# Patient Record
Sex: Male | Born: 1955 | Race: White | Hispanic: No | Marital: Married | Smoking: Former smoker
Health system: Southern US, Community
[De-identification: ages and names within clinical notes are randomized; demographics above are authoritative.]

## PROBLEM LIST (undated history)

## (undated) DIAGNOSIS — G629 Polyneuropathy, unspecified: Secondary | ICD-10-CM

## (undated) DIAGNOSIS — I10 Essential (primary) hypertension: Secondary | ICD-10-CM

## (undated) DIAGNOSIS — K219 Gastro-esophageal reflux disease without esophagitis: Secondary | ICD-10-CM

## (undated) DIAGNOSIS — M1990 Unspecified osteoarthritis, unspecified site: Secondary | ICD-10-CM

## (undated) HISTORY — PX: COLONOSCOPY WITH PROPOFOL: SHX5780

---

## 2007-03-03 ENCOUNTER — Ambulatory Visit: Payer: Self-pay | Admitting: Internal Medicine

## 2008-07-14 IMAGING — CR DG HIP COMPLETE 2+V*L*
1 series · 2 of 2 positions shown · non-contrast
Comparison: none

REASON FOR EXAM: hip pain
COMMENTS:

PROCEDURE:     DXR - DXR HIP LEFT COMPLETE  - March 03, 2007  [DATE]
RESULT:     No fracture, dislocation or other acute bony abnormality is
identified. The hip joint space is well maintained.

[Series 1: view not recorded · 0.17mm/px · 2 of 2 slices shown]
[im 1/2]
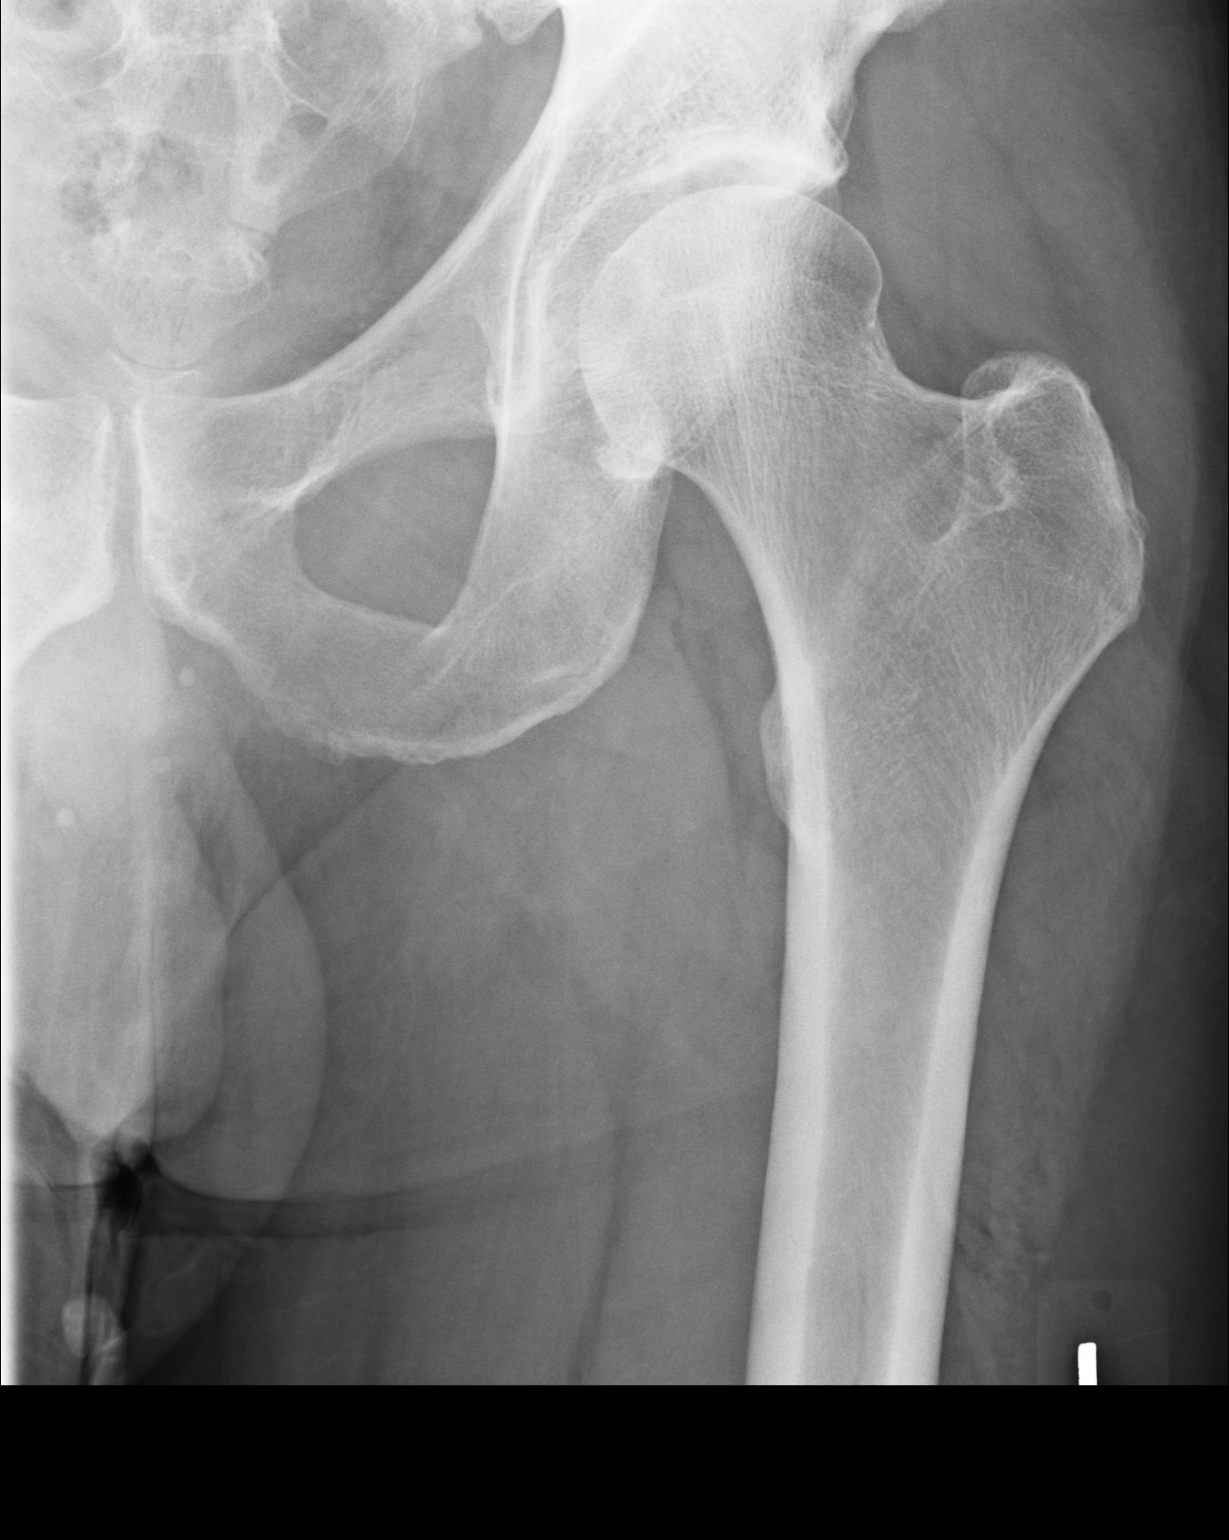
[im 2/2]
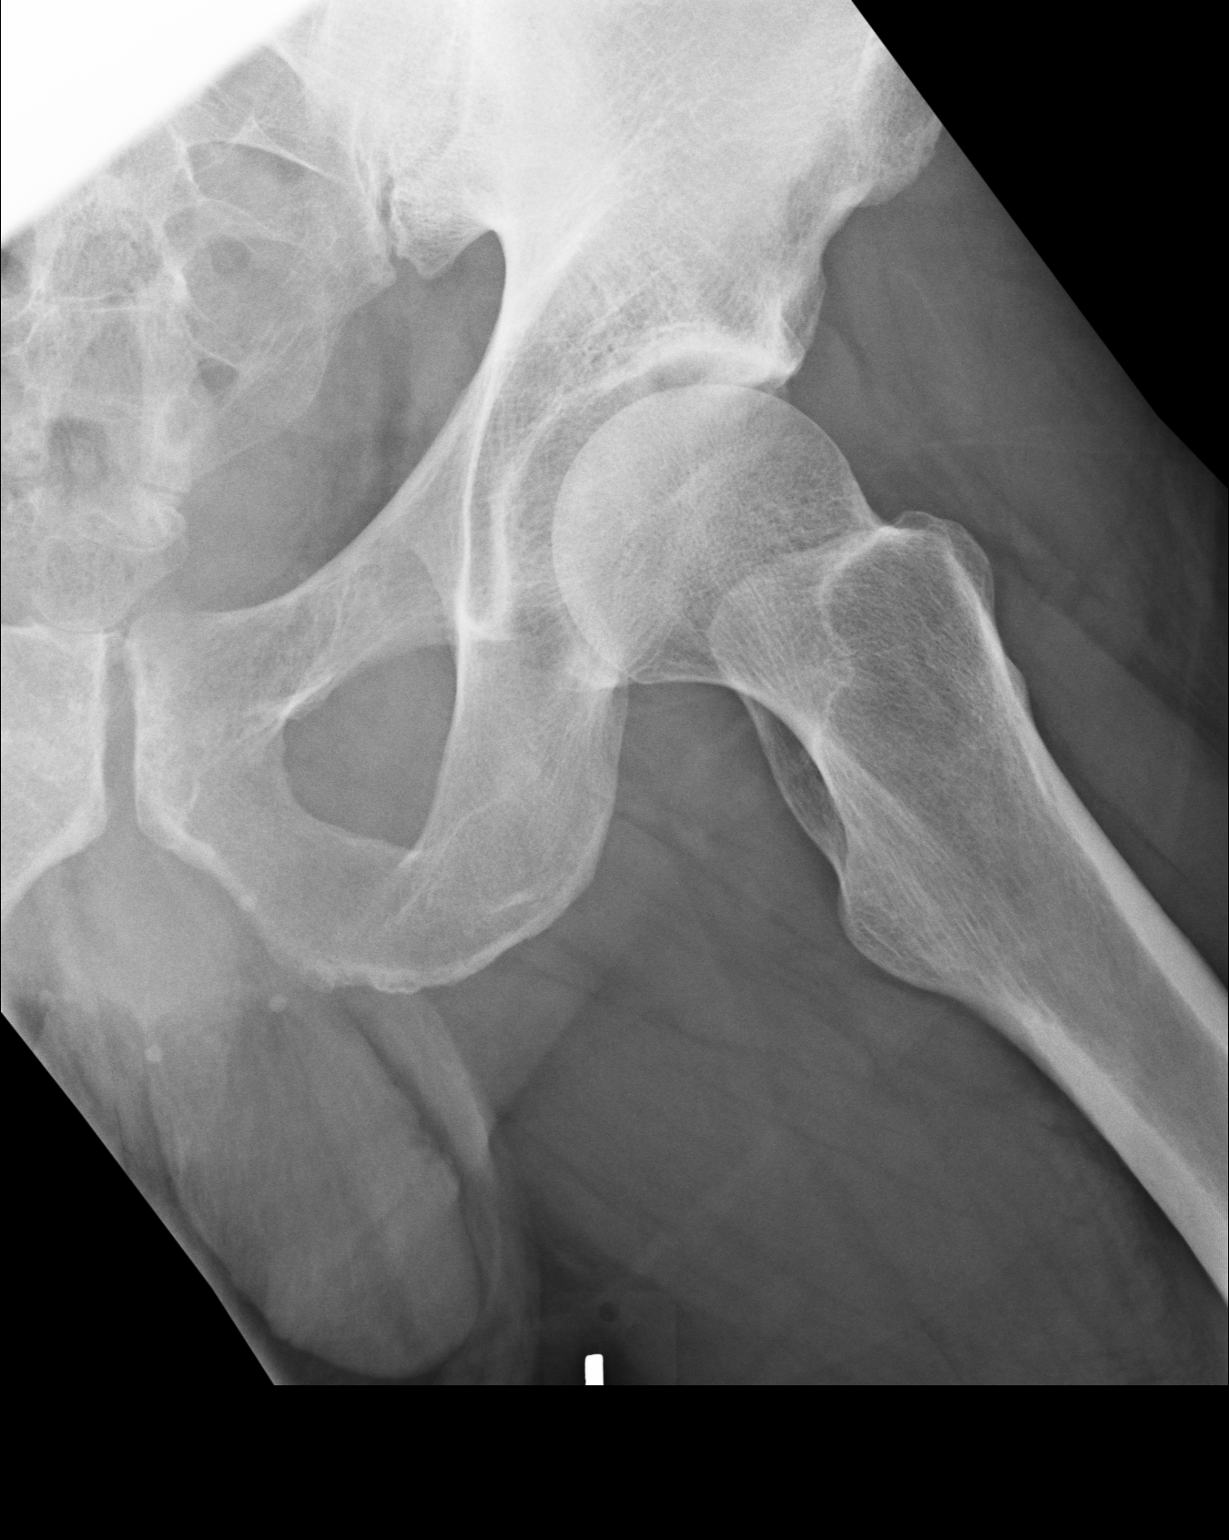

[2 of 2 positions shown; findings below may reference images not displayed]

IMPRESSION: No acute abnormalities are noted.

## 2008-07-14 IMAGING — CR DG LUMBAR SPINE AP/LAT/OBLIQUES W/ FLEX AND EXT
1 series · 5 of 5 positions shown · non-contrast
Comparison: none

REASON FOR EXAM: Back Pain
COMMENTS:

[Series 1: view not recorded · 0.17mm/px · 5 of 5 slices shown]
[im 1/5]
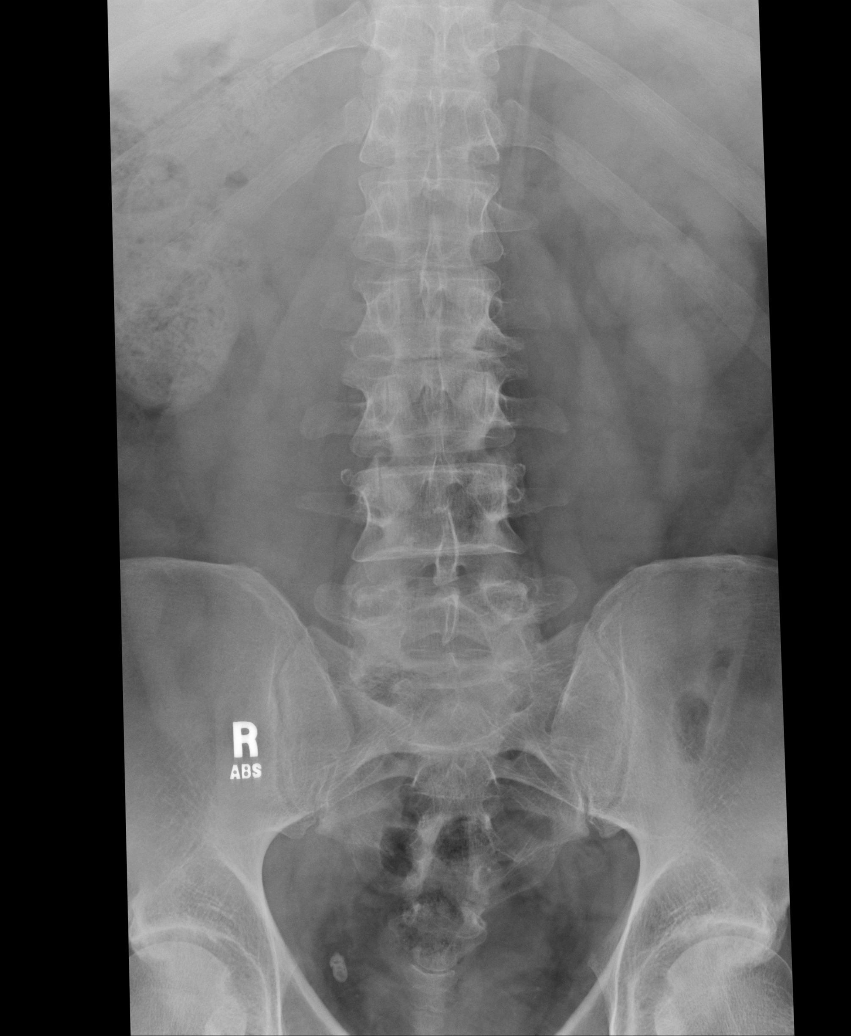
[im 2/5]
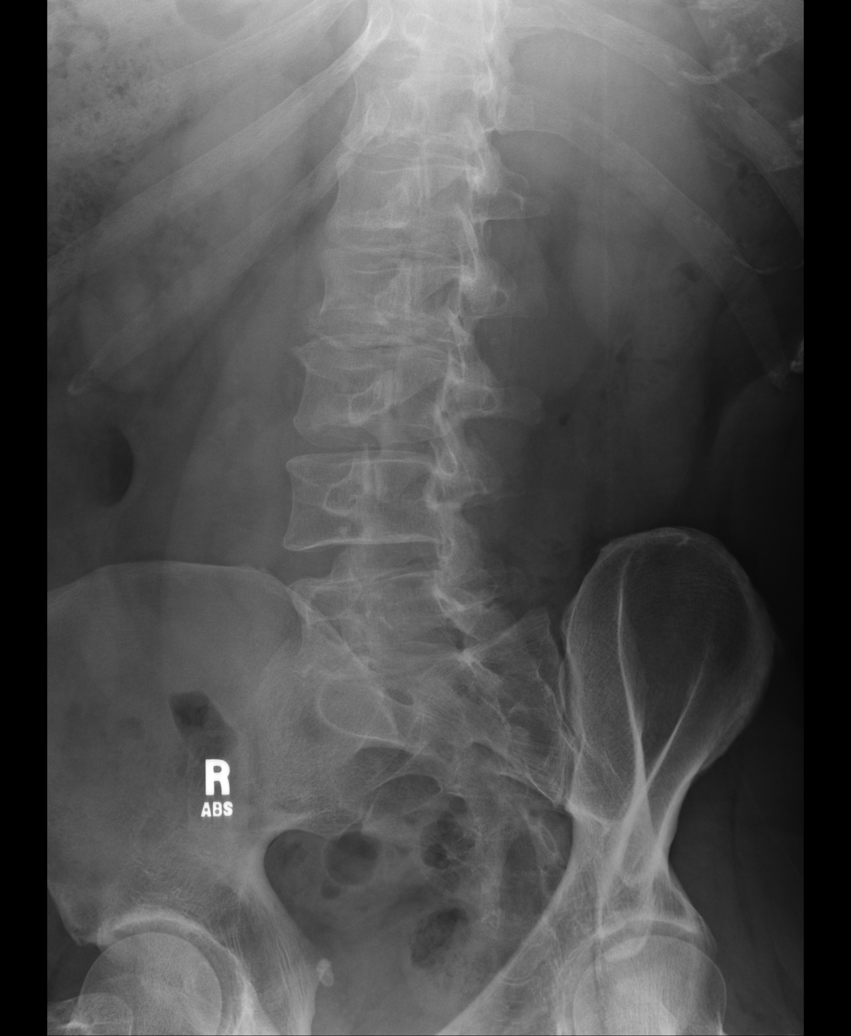
[im 3/5]
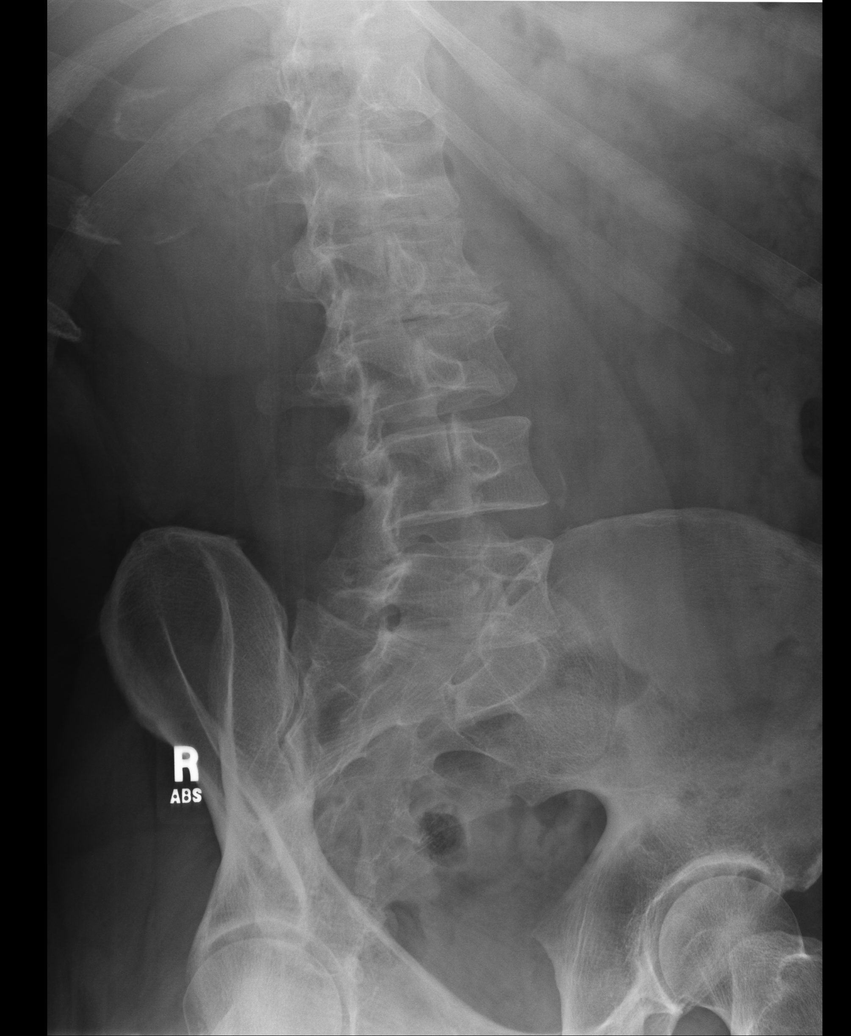
[im 4/5]
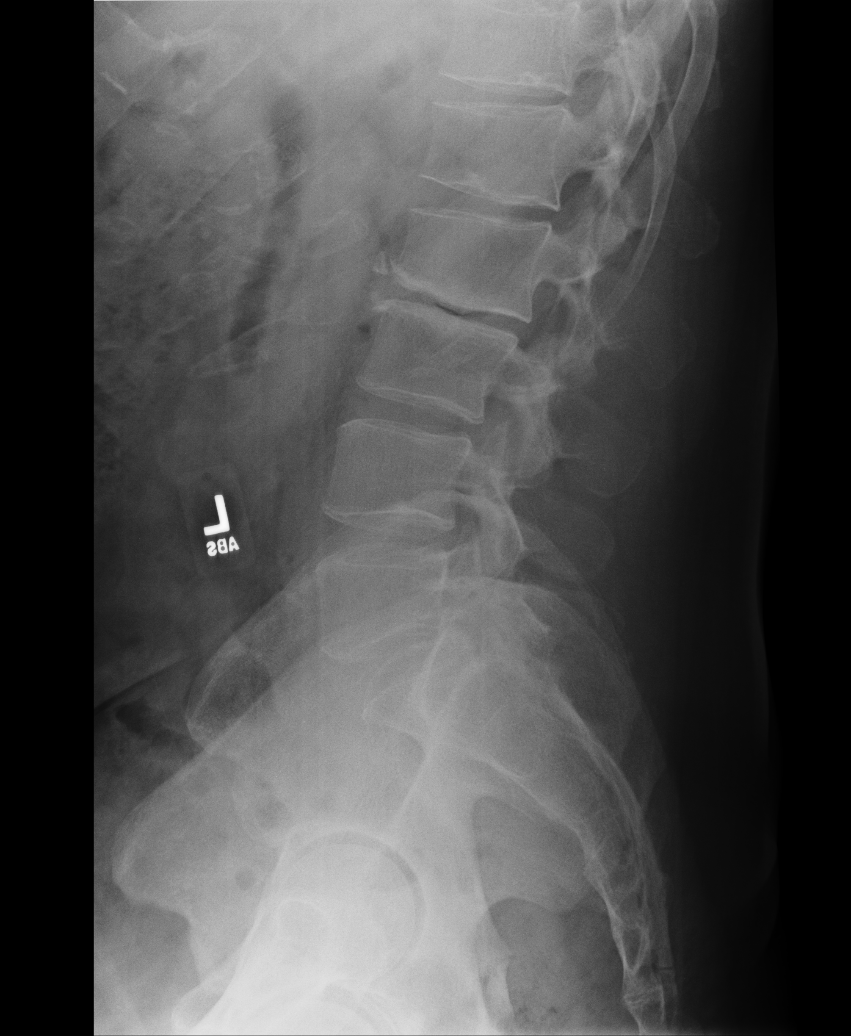
[im 5/5]
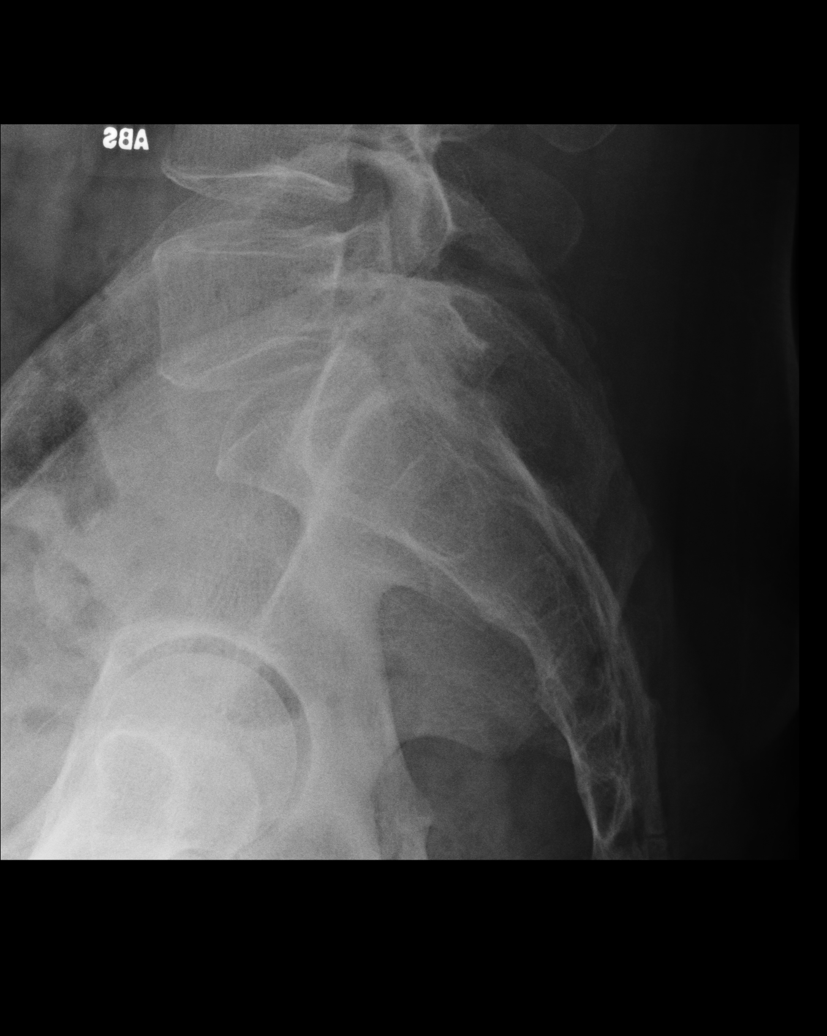

[5 of 5 positions shown; findings below may reference images not displayed]

PROCEDURE:     DXR - DXR LUMBAR SPINE WITH OBLIQUES  - March 03, 2007  [DATE]

RESULT:     The vertebral body heights and the intervertebral disc spaces
are well maintained. The vertebral body alignment is normal. There is
narrowing of the L2-L3 intervertebral disc space compatible with disc
disease. The intervertebral disc spaces otherwise are well maintained.
Vertebral body alignment is normal. Oblique views show no significant
abnormalities of the articular facets. The pedicles are bilaterally intact.
IMPRESSION: 1. No fracture is seen.
2. There is narrowing of the L2-L3 intervertebral disc space consistent with
disc disease.

## 2020-01-18 ENCOUNTER — Other Ambulatory Visit: Payer: Self-pay | Admitting: Internal Medicine

## 2020-01-20 ENCOUNTER — Other Ambulatory Visit: Payer: Self-pay | Admitting: Internal Medicine

## 2020-02-09 ENCOUNTER — Other Ambulatory Visit: Payer: Self-pay | Admitting: Internal Medicine

## 2020-03-17 ENCOUNTER — Ambulatory Visit: Payer: Medicare Other | Admitting: Internal Medicine

## 2020-03-17 ENCOUNTER — Encounter: Payer: Self-pay | Admitting: Internal Medicine

## 2020-03-17 ENCOUNTER — Other Ambulatory Visit: Payer: Self-pay

## 2020-03-17 ENCOUNTER — Ambulatory Visit (INDEPENDENT_AMBULATORY_CARE_PROVIDER_SITE_OTHER): Payer: Medicare Other | Admitting: Internal Medicine

## 2020-03-17 VITALS — BP 121/99 | HR 85 | Ht 70.0 in | Wt 225.3 lb

## 2020-03-17 DIAGNOSIS — M158 Other polyosteoarthritis: Secondary | ICD-10-CM

## 2020-03-17 DIAGNOSIS — M159 Polyosteoarthritis, unspecified: Secondary | ICD-10-CM | POA: Diagnosis not present

## 2020-03-17 DIAGNOSIS — E8881 Metabolic syndrome: Secondary | ICD-10-CM | POA: Diagnosis not present

## 2020-03-17 DIAGNOSIS — Z0000 Encounter for general adult medical examination without abnormal findings: Secondary | ICD-10-CM | POA: Diagnosis not present

## 2020-03-17 DIAGNOSIS — K219 Gastro-esophageal reflux disease without esophagitis: Secondary | ICD-10-CM | POA: Insufficient documentation

## 2020-03-17 DIAGNOSIS — Z87891 Personal history of nicotine dependence: Secondary | ICD-10-CM

## 2020-03-17 LAB — LIPID PANEL
Cholesterol: 160 mg/dL (ref <200)
HDL: 41 mg/dL (ref >=40)
LDL Cholesterol (Calc): 98 mg/dL (calc)
Non-HDL Cholesterol (Calc): 119 mg/dL (calc) (ref <130)
Total CHOL/HDL Ratio: 3.9 (calc) (ref <5.0)
Triglycerides: 112 mg/dL (ref <150)

## 2020-03-17 LAB — COMPLETE METABOLIC PANEL WITH GFR
AG Ratio: 1.7 (calc) (ref 1.0–2.5)
ALT: 15 U/L (ref 9–46)
AST: 17 U/L (ref 10–35)
Albumin: 4.5 g/dL (ref 3.6–5.1)
Alkaline phosphatase (APISO): 62 U/L (ref 35–144)
BUN: 17 mg/dL (ref 7–25)
CO2: 25 mmol/L (ref 20–32)
Calcium: 9.2 mg/dL (ref 8.6–10.3)
Chloride: 101 mmol/L (ref 98–110)
Creat: 0.84 mg/dL (ref 0.70–1.25)
GFR, Est African American: 106 mL/min/{1.73_m2} (ref >=60)
GFR, Est Non African American: 92 mL/min/{1.73_m2} (ref >=60)
Globulin: 2.7 g/dL (calc) (ref 1.9–3.7)
Glucose, Bld: 93 mg/dL (ref 65–99)
Potassium: 4.4 mmol/L (ref 3.5–5.3)
Sodium: 137 mmol/L (ref 135–146)
Total Bilirubin: 0.8 mg/dL (ref 0.2–1.2)
Total Protein: 7.2 g/dL (ref 6.1–8.1)

## 2020-03-17 LAB — CBC WITH DIFFERENTIAL/PLATELET
Absolute Monocytes: 662 cells/uL (ref 200–950)
Basophils Absolute: 22 cells/uL (ref 0–200)
Basophils Relative: 0.3 %
Eosinophils Absolute: 158 cells/uL (ref 15–500)
Eosinophils Relative: 2.2 %
HCT: 47.3 % (ref 38.5–50.0)
Hemoglobin: 15.7 g/dL (ref 13.2–17.1)
Lymphs Abs: 1908 cells/uL (ref 850–3900)
MCH: 29.1 pg (ref 27.0–33.0)
MCHC: 33.2 g/dL (ref 32.0–36.0)
MCV: 87.8 fL (ref 80.0–100.0)
MPV: 9.4 fL (ref 7.5–12.5)
Monocytes Relative: 9.2 %
Neutro Abs: 4450 cells/uL (ref 1500–7800)
Neutrophils Relative %: 61.8 %
Platelets: 258 10*3/uL (ref 140–400)
RBC: 5.39 10*6/uL (ref 4.20–5.80)
RDW: 12.9 % (ref 11.0–15.0)
Total Lymphocyte: 26.5 %
WBC: 7.2 10*3/uL (ref 3.8–10.8)

## 2020-03-17 LAB — TSH: TSH: 0.55 mIU/L (ref 0.40–4.50)

## 2020-03-17 LAB — PSA: PSA: 1.6 ng/mL (ref <=4.0)

## 2020-03-17 NOTE — Progress Notes (Incomplete)
Established Patient Office Visit  SUBJECTIVE:  Subjective  Patient ID: Randy Gill, male    DOB: April 13, 1956  Age: 64 y.o. MRN: [id number]  CC: No chief complaint on file.   HPI Randy Gill is a 64 y.o. male presenting today for ***  No past medical history on file.  *** The histories are not reviewed yet. Please review them in the "History" navigator section and refresh this [place].  No family history on file.  Social History   Socioeconomic History  . Marital status: Married    Spouse name: Not on file  . Number of children: Not on file  . Years of education: Not on file  . Highest education level: Not on file  Occupational History  . Not on file  Tobacco Use  . Smoking status: Not on file  Substance and Sexual Activity  . Alcohol use: Not on file  . Drug use: Not on file  . Sexual activity: Not on file  Other Topics Concern  . Not on file  Social History Narrative  . Not on file   Social Determinants of Health   Financial Resource Strain:   . Difficulty of Paying Living Expenses:   Food Insecurity:   . Worried About Charity fundraiser in the Last Year:   . Arboriculturist in the Last Year:   Transportation Needs:   . Film/video editor (Medical):   Marland Kitchen Lack of Transportation (Non-Medical):   Physical Activity:   . Days of Exercise per Week:   . Minutes of Exercise per Session:   Stress:   . Feeling of Stress :   Social Connections:   . Frequency of Communication with Friends and Family:   . Frequency of Social Gatherings with Friends and Family:   . Attends Religious Services:   . Active Member of Clubs or Organizations:   . Attends Archivist Meetings:   Marland Kitchen Marital Status:   Intimate Partner Violence:   . Fear of Current or Ex-Partner:   . Emotionally Abused:   Marland Kitchen Physically Abused:   . Sexually Abused:      Current Outpatient Medications:  .  meloxicam (MOBIC) 15 MG tablet, TAKE ONE TABLET BY MOUTH EVERY DAY, Disp: 90  tablet, Rfl: 0 .  omeprazole (PRILOSEC) 20 MG capsule, TAKE 1 CAPSULE BY MOUTH ONCE DAILY, Disp: 90 capsule, Rfl: 3   Not on File  ROS Review of Systems  Constitutional: Negative.   HENT: Negative.   Eyes: Negative.   Respiratory: Negative.   Cardiovascular: Negative.   Gastrointestinal: Negative.   Endocrine: Negative.   Genitourinary: Negative.   Musculoskeletal: Negative.   Skin: Negative.   Allergic/Immunologic: Negative.   Neurological: Negative.   Hematological: Negative.   Psychiatric/Behavioral: Negative.   All other systems reviewed and are negative.    OBJECTIVE:    Physical Exam Vitals reviewed.  Constitutional:      Appearance: Normal appearance.  HENT:     Mouth/Throat:     Mouth: Mucous membranes are moist.  Eyes:     Pupils: Pupils are equal, round, and reactive to light.  Neck:     Vascular: No carotid bruit.  Cardiovascular:     Rate and Rhythm: Normal rate and regular rhythm.     Pulses: Normal pulses.     Heart sounds: Normal heart sounds.  Pulmonary:     Effort: Pulmonary effort is normal.     Breath sounds: Normal breath sounds.  Abdominal:  General: Bowel sounds are normal.     Palpations: Abdomen is soft. There is no hepatomegaly, splenomegaly or mass.     Tenderness: There is no abdominal tenderness.     Hernia: No hernia is present.  Musculoskeletal:     Cervical back: Neck supple.     Right lower leg: No edema.     Left lower leg: No edema.  Skin:    Findings: No rash.  Neurological:     Mental Status: He is alert and oriented to person, place, and time.     Motor: No weakness.  Psychiatric:        Mood and Affect: Mood normal.        Behavior: Behavior normal.     There were no vitals taken for this visit. Wt Readings from Last 3 Encounters:  No data found for Wt    Health Maintenance Due  Topic Date Due  . Hepatitis C Screening  Never done  . COVID-19 Vaccine (1) Never done  . HIV Screening  Never done  .  TETANUS/TDAP  Never done  . COLONOSCOPY  Never done  . PNA vac Low Risk Adult (1 of 2 - PCV13) Never done    There are no preventive care reminders to display for this patient.  No flowsheet data found. No flowsheet data found.  No results found for: TSH No results found for: ALBUMIN, ANIONGAP, EGFR, GFR No results found for: CHOL, HDL, LDLCALC, CHOLHDL No results found for: TRIG No results found for: HGBA1C    ASSESSMENT & PLAN:   Problem List Items Addressed This Visit    None      No orders of the defined types were placed in this encounter.   {NOTE: }  Follow-up: No follow-ups on file.    Dr. Jane Canary [facility] [address] [place], [place] [address]   By signing my name below, I, Dawayne Cirri, attest that this documentation has been prepared under the direction and in the presence of Randy Athens, MD. Electronically Signed: Carlean Gill 03/17/20, 8:35 AM   {Add Scribe Attestation Statement}

## 2020-03-17 NOTE — Patient Instructions (Signed)
-  Will see back in January. Will be seen sooner if needed.

## 2020-03-17 NOTE — Assessment & Plan Note (Signed)
Pt physical examination is unremarkable. He has seen the eye doctor last year and is going to see them again. He has his COVID19 vaccines. His prostate examination was done which was normal. Will draw CBC and metabolic panel, TSH, PSA. He will be scheduled to have a colonoscopy.

## 2020-03-17 NOTE — Progress Notes (Signed)
 Established Patient Office Visit  SUBJECTIVE:  Subjective  Patient ID: Randy Gill, male    DOB: 10/03/1955  Age: 64 y.o. MRN: [id number]  CC:  Chief Complaint  Patient presents with  . Annual Exam    welcome to medicare     HPI Randy Gill is a 64 y.o. male presenting today for his welcome to Medicare Physical. The ball of his feet and hands hurt due to osteoarthritis. Pt has heberden's nodes. He is staying active. Pt has not had any eye trouble, or hearing trouble. Pt saw his eye doctor last year. There has been no trouble swallowing.  He has not had pneumonia. Pt has GERD and takes omeprazole daily. He has not gotten a colonoscopy recently. Pt has gotten his COVID19 vaccines. He has lost 15 pounds of weight. His goal is to be 200 pounds. Pt's skin is tender on the lower legs when he runs into things. He has had no chest pain. Awhile ago pt had chest pain said to be due to muscular but lately he has not had any pains. His cholesterol has been good. Pt's brother had prostate cancer.   History reviewed. No pertinent past medical history.  History reviewed. No pertinent surgical history.  History reviewed. No pertinent family history.  Social History   Socioeconomic History  . Marital status: Married    Spouse name: Not on file  . Number of children: Not on file  . Years of education: Not on file  . Highest education level: Not on file  Occupational History  . Not on file  Tobacco Use  . Smoking status: Former Smoker  . Smokeless tobacco: Never Used  Substance and Sexual Activity  . Alcohol use: Yes  . Drug use: Not Currently  . Sexual activity: Yes  Other Topics Concern  . Not on file  Social History Narrative  . Not on file   Social Determinants of Health   Financial Resource Strain:   . Difficulty of Paying Living Expenses:   Food Insecurity:   . Worried About Running Out of Food in the Last Year:   . Ran Out of Food in the Last Year:   Transportation  Needs:   . Lack of Transportation (Medical):   . Lack of Transportation (Non-Medical):   Physical Activity:   . Days of Exercise per Week:   . Minutes of Exercise per Session:   Stress:   . Feeling of Stress :   Social Connections:   . Frequency of Communication with Friends and Family:   . Frequency of Social Gatherings with Friends and Family:   . Attends Religious Services:   . Active Member of Clubs or Organizations:   . Attends Club or Organization Meetings:   . Marital Status:   Intimate Partner Violence:   . Fear of Current or Ex-Partner:   . Emotionally Abused:   . Physically Abused:   . Sexually Abused:      Current Outpatient Medications:  .  ALPRAZolam (XANAX) 0.5 MG tablet, Take 0.5 mg by mouth daily., Disp: , Rfl:  .  amLODipine (NORVASC) 10 MG tablet, Take 10 mg by mouth daily., Disp: , Rfl:  .  DULoxetine (CYMBALTA) 60 MG capsule, Take 60 mg by mouth daily., Disp: , Rfl:  .  losartan-hydrochlorothiazide (HYZAAR) 100-12.5 MG tablet, Take 1 tablet by mouth daily., Disp: , Rfl:  .  meloxicam (MOBIC) 15 MG tablet, TAKE ONE TABLET BY MOUTH EVERY DAY, Disp: 90 tablet, Rfl: 0 .    omeprazole (PRILOSEC) 20 MG capsule, TAKE 1 CAPSULE BY MOUTH ONCE DAILY, Disp: 90 capsule, Rfl: 3 .  sildenafil (REVATIO) 20 MG tablet, Take 20 mg by mouth daily., Disp: , Rfl:    No Known Allergies  ROS Review of Systems  Constitutional: Negative.   HENT: Negative.   Eyes: Negative.   Respiratory: Negative.   Cardiovascular: Negative.   Gastrointestinal: Negative.   Endocrine: Negative.   Genitourinary: Negative.   Musculoskeletal: Negative.   Skin: Negative.   Allergic/Immunologic: Negative.   Neurological: Negative.   Hematological: Negative.   Psychiatric/Behavioral: Negative.   All other systems reviewed and are negative.    OBJECTIVE:    Physical Exam Vitals reviewed.  Constitutional:      Appearance: Normal appearance.  HENT:     Mouth/Throat:     Mouth: Mucous  membranes are moist.  Eyes:     Pupils: Pupils are equal, round, and reactive to light.  Cardiovascular:     Rate and Rhythm: Normal rate and regular rhythm.     Pulses: Normal pulses.     Heart sounds: Normal heart sounds.  Pulmonary:     Effort: Pulmonary effort is normal.     Breath sounds: Normal breath sounds.  Abdominal:     General: Bowel sounds are normal.     Palpations: Abdomen is soft. There is no hepatomegaly or splenomegaly.  Genitourinary:    Prostate: Normal.  Musculoskeletal:     Cervical back: Neck supple.     Comments: heberden's nodes Osteoarthritis   Neurological:     Mental Status: He is alert and oriented to person, place, and time.  Psychiatric:        Mood and Affect: Mood normal.        Behavior: Behavior normal.     BP (!) 121/99   Pulse 85   Ht 5' 10" (1.778 m)   Wt (!) 225 lb 4.8 oz (102.2 kg)   BMI 32.33 kg/m  Wt Readings from Last 3 Encounters:  03/17/20 (!) 225 lb 4.8 oz (102.2 kg)    Health Maintenance Due  Topic Date Due  . Hepatitis C Screening  Never done  . COVID-19 Vaccine (1) Never done  . HIV Screening  Never done  . TETANUS/TDAP  Never done  . COLONOSCOPY  Never done  . PNA vac Low Risk Adult (1 of 2 - PCV13) Never done    There are no preventive care reminders to display for this patient.  No flowsheet data found. No flowsheet data found.  No results found for: TSH No results found for: ALBUMIN, ANIONGAP, EGFR, GFR No results found for: CHOL, HDL, LDLCALC, CHOLHDL No results found for: TRIG No results found for: HGBA1C    ASSESSMENT & PLAN:   Problem List Items Addressed This Visit      Digestive   Gastroesophageal reflux disease without esophagitis    - The patient's GERD is stable on medication.  - Instructed the patient to avoid eating spicy and acidic foods, as well as foods high in fat. - Instructed the patient to avoid eating large meals or meals 2-3 hours prior to sleeping.         Musculoskeletal  and Integument   Osteoarthritis of multiple joints     Other   Metabolic syndrome    Pt has lost weight. He is very active      Annual physical exam - Primary    Pt physical examination is unremarkable. He has seen the eye  doctor last year and is going to see them again. He has his COVID19 vaccines. His prostate examination was done which was normal. Will draw CBC and metabolic panel, TSH, PSA. He will be scheduled to have a colonoscopy.          No orders of the defined types were placed in this encounter.    Follow-up: Return in about 6 months (around 09/17/2020).  Pt will be scheduled for colonscopy, eye doctor check, blood will be drawn for CBC, metabolic pane, TSH, PSA. Pt already had a CVOID19 vaccines. His prostate examination was normal.   Dr. Javed Masoud [facility] [address], [place], [place] [address]   By signing my name below, I, Elaine Hinkel, attest that this documentation has been prepared under the direction and in the presence of Masoud, Javed, MD. Electronically Signed: Javed Masoud, MD 03/17/20, 10:16 AM   I personally performed the services described in this documentation, which was SCRIBED in my presence. The recorded information has been reviewed and considered accurate. It has been edited as necessary during review. Javed Masoud, MD   

## 2020-03-17 NOTE — Assessment & Plan Note (Signed)
-   The patient's GERD is stable on medication.  - Instructed the patient to avoid eating spicy and acidic foods, as well as foods high in fat. - Instructed the patient to avoid eating large meals or meals 2-3 hours prior to sleeping. 

## 2020-03-17 NOTE — Assessment & Plan Note (Signed)
Pt has lost weight. He is very active

## 2020-03-26 ENCOUNTER — Other Ambulatory Visit: Payer: Self-pay | Admitting: Internal Medicine

## 2020-03-29 ENCOUNTER — Other Ambulatory Visit: Payer: Self-pay

## 2020-03-29 ENCOUNTER — Other Ambulatory Visit: Payer: Self-pay | Admitting: Internal Medicine

## 2020-03-30 ENCOUNTER — Telehealth (INDEPENDENT_AMBULATORY_CARE_PROVIDER_SITE_OTHER): Payer: Self-pay | Admitting: Gastroenterology

## 2020-03-30 ENCOUNTER — Other Ambulatory Visit: Payer: Self-pay

## 2020-03-30 DIAGNOSIS — S82409A Unspecified fracture of shaft of unspecified fibula, initial encounter for closed fracture: Secondary | ICD-10-CM | POA: Insufficient documentation

## 2020-03-30 DIAGNOSIS — Z1211 Encounter for screening for malignant neoplasm of colon: Secondary | ICD-10-CM

## 2020-03-30 DIAGNOSIS — S93409A Sprain of unspecified ligament of unspecified ankle, initial encounter: Secondary | ICD-10-CM | POA: Insufficient documentation

## 2020-03-30 MED ORDER — GOLYTELY 236 G PO SOLR: 4000.0000 mL | Freq: Once | ORAL | 0 refills | Status: AC

## 2020-03-30 NOTE — Progress Notes (Signed)
Gastroenterology Pre-Procedure Review  Request Date: Wed 04/13/20 Requesting Physician: Dr. Maximino Greenland  PATIENT REVIEW QUESTIONS: The patient responded to the following health history questions as indicated:    1. Are you having any GI issues? no 2. Do you have a personal history of Polyps? no 3. Do you have a family history of Colon Cancer or Polyps? no 4. Diabetes Mellitus? no 5. Joint replacements in the past 12 months?no 6. Major health problems in the past 3 months?no 7. Any artificial heart valves, MVP, or defibrillator?no    MEDICATIONS & ALLERGIES:    Patient reports the following regarding taking any anticoagulation/antiplatelet therapy:   Plavix, Coumadin, Eliquis, Xarelto, Lovenox, Pradaxa, Brilinta, or Effient? no Aspirin? no  Patient confirms/reports the following medications:  Current Outpatient Medications  Medication Sig Dispense Refill  . ALPRAZolam (XANAX) 0.5 MG tablet Take 0.5 mg by mouth daily.    Marland Kitchen amLODipine (NORVASC) 10 MG tablet Take 10 mg by mouth daily.    . DULoxetine (CYMBALTA) 60 MG capsule TAKE 1 CAPSULE BY MOUTH EVERY DAY 90 capsule 3  . losartan-hydrochlorothiazide (HYZAAR) 100-12.5 MG tablet Take 1 tablet by mouth daily.    . meloxicam (MOBIC) 15 MG tablet TAKE ONE TABLET BY MOUTH EVERY DAY 90 tablet 0  . omeprazole (PRILOSEC) 20 MG capsule TAKE 1 CAPSULE BY MOUTH ONCE DAILY 90 capsule 3  . sildenafil (REVATIO) 20 MG tablet Take 20 mg by mouth daily.    . polyethylene glycol (GOLYTELY) 236 g solution Take 4,000 mLs by mouth once for 1 dose. 4000 mL 0   No current facility-administered medications for this visit.    Patient confirms/reports the following allergies:  No Known Allergies  No orders of the defined types were placed in this encounter.   AUTHORIZATION INFORMATION Primary Insurance: 1D#: Group #:  Secondary Insurance: 1D#: Group #:  SCHEDULE INFORMATION: Date: Wed 04/13/20 Time: Location:[facility]

## 2020-04-11 ENCOUNTER — Other Ambulatory Visit
Admission: RE | Admit: 2020-04-11 | Discharge: 2020-04-11 | Disposition: A | Discharge status: Home / Self Care | Payer: Medicare Other | Source: Ambulatory Visit | Attending: Gastroenterology | Admitting: Gastroenterology

## 2020-04-11 ENCOUNTER — Other Ambulatory Visit: Payer: Self-pay

## 2020-04-11 DIAGNOSIS — Z20822 Contact with and (suspected) exposure to covid-19: Secondary | ICD-10-CM | POA: Diagnosis not present

## 2020-04-11 DIAGNOSIS — Z01812 Encounter for preprocedural laboratory examination: Secondary | ICD-10-CM | POA: Diagnosis present

## 2020-04-12 LAB — SARS CORONAVIRUS 2 (TAT 6-24 HRS): SARS Coronavirus 2: NEGATIVE

## 2020-04-13 ENCOUNTER — Ambulatory Visit
Admission: RE | Admit: 2020-04-13 | Discharge: 2020-04-13 | Disposition: A | Discharge status: Home / Self Care | Payer: Medicare Other | Attending: Gastroenterology | Admitting: Gastroenterology

## 2020-04-13 ENCOUNTER — Ambulatory Visit: Payer: Medicare Other | Admitting: Certified Registered Nurse Anesthetist

## 2020-04-13 ENCOUNTER — Encounter
Admission: RE | Disposition: A | Discharge status: Home / Self Care | Payer: Self-pay | Source: Home / Self Care | Attending: Gastroenterology

## 2020-04-13 ENCOUNTER — Encounter: Payer: Self-pay | Admitting: Gastroenterology

## 2020-04-13 ENCOUNTER — Other Ambulatory Visit: Payer: Self-pay

## 2020-04-13 DIAGNOSIS — M1990 Unspecified osteoarthritis, unspecified site: Secondary | ICD-10-CM | POA: Insufficient documentation

## 2020-04-13 DIAGNOSIS — Z79899 Other long term (current) drug therapy: Secondary | ICD-10-CM | POA: Diagnosis not present

## 2020-04-13 DIAGNOSIS — K219 Gastro-esophageal reflux disease without esophagitis: Secondary | ICD-10-CM | POA: Insufficient documentation

## 2020-04-13 DIAGNOSIS — Z87891 Personal history of nicotine dependence: Secondary | ICD-10-CM | POA: Insufficient documentation

## 2020-04-13 DIAGNOSIS — K635 Polyp of colon: Secondary | ICD-10-CM | POA: Diagnosis not present

## 2020-04-13 DIAGNOSIS — I10 Essential (primary) hypertension: Secondary | ICD-10-CM | POA: Diagnosis not present

## 2020-04-13 DIAGNOSIS — Z791 Long term (current) use of non-steroidal anti-inflammatories (NSAID): Secondary | ICD-10-CM | POA: Diagnosis not present

## 2020-04-13 DIAGNOSIS — K5730 Diverticulosis of large intestine without perforation or abscess without bleeding: Secondary | ICD-10-CM | POA: Insufficient documentation

## 2020-04-13 DIAGNOSIS — Z1211 Encounter for screening for malignant neoplasm of colon: Secondary | ICD-10-CM | POA: Insufficient documentation

## 2020-04-13 HISTORY — DX: Essential (primary) hypertension: I10

## 2020-04-13 HISTORY — PX: COLONOSCOPY WITH PROPOFOL: SHX5780

## 2020-04-13 HISTORY — DX: Gastro-esophageal reflux disease without esophagitis: K21.9

## 2020-04-13 HISTORY — DX: Unspecified osteoarthritis, unspecified site: M19.90

## 2020-04-13 HISTORY — DX: Polyneuropathy, unspecified: G62.9

## 2020-04-13 SURGERY — COLONOSCOPY WITH PROPOFOL
Anesthesia: General

## 2020-04-13 MED ORDER — LIDOCAINE HCL (PF) 2 % IJ SOLN
INTRAMUSCULAR | Status: AC
  Filled 2020-04-13: qty 5

## 2020-04-13 MED ORDER — SODIUM CHLORIDE 0.9 % IV SOLN: INTRAVENOUS | Status: DC

## 2020-04-13 MED ORDER — PROPOFOL 10 MG/ML IV BOLUS
INTRAVENOUS | Status: DC | PRN
  Administered 2020-04-13: 30 mg via INTRAVENOUS
  Administered 2020-04-13: 60 mg via INTRAVENOUS
  Administered 2020-04-13: 20 mg via INTRAVENOUS

## 2020-04-13 MED ORDER — PROPOFOL 500 MG/50ML IV EMUL
INTRAVENOUS | Status: DC | PRN
  Administered 2020-04-13: 150 ug/kg/min via INTRAVENOUS

## 2020-04-13 MED ORDER — PROPOFOL 500 MG/50ML IV EMUL
INTRAVENOUS | Status: AC
  Filled 2020-04-13: qty 50

## 2020-04-13 MED ORDER — LIDOCAINE HCL (CARDIAC) PF 100 MG/5ML IV SOSY
PREFILLED_SYRINGE | INTRAVENOUS | Status: DC | PRN
  Administered 2020-04-13: 50 mg via INTRAVENOUS

## 2020-04-13 NOTE — Anesthesia Postprocedure Evaluation (Signed)
Anesthesia Post Note  Patient: Randy Gill  Procedure(s) Performed: COLONOSCOPY WITH PROPOFOL (N/A )  Patient location during evaluation: Endoscopy Anesthesia Type: General Level of consciousness: awake and alert Pain management: pain level controlled Vital Signs Assessment: post-procedure vital signs reviewed and stable Respiratory status: spontaneous breathing, nonlabored ventilation, respiratory function stable and patient connected to nasal cannula oxygen Cardiovascular status: blood pressure returned to baseline and stable Postop Assessment: no apparent nausea or vomiting Anesthetic complications: no   No complications documented.   Last Vitals:  Vitals:   04/13/20 1018 04/13/20 1028  BP: 102/78 101/80  Pulse: 60 67  Resp: 13 11  Temp:    SpO2: 97% 100%    Last Pain:  Vitals:   04/13/20 1028  TempSrc:   PainSc: 0-No pain                 Corinda Gubler

## 2020-04-13 NOTE — Anesthesia Preprocedure Evaluation (Signed)
Anesthesia Evaluation  Patient identified by MRN, date of birth, ID band Patient awake    Reviewed: Allergy & Precautions, NPO status , Patient's Chart, lab work & pertinent test results  History of Anesthesia Complications Negative for: history of anesthetic complications  Airway Mallampati: II  TM Distance: >3 FB Neck ROM: Full    Dental no notable dental hx. (+) Teeth Intact   Pulmonary neg pulmonary ROS, neg sleep apnea, neg COPD, Patient abstained from smoking.Not current smoker, former smoker,    Pulmonary exam normal breath sounds clear to auscultation       Cardiovascular Exercise Tolerance: Good METShypertension, (-) CAD and (-) Past MI (-) dysrhythmias  Rhythm:Regular Rate:Normal - Systolic murmurs    Neuro/Psych negative neurological ROS  negative psych ROS   GI/Hepatic GERD  Medicated and Controlled,(+)     (-) substance abuse  ,   Endo/Other  neg diabetes  Renal/GU negative Renal ROS     Musculoskeletal   Abdominal   Peds  Hematology   Anesthesia Other Findings Past Medical History: No date: GERD (gastroesophageal reflux disease) No date: Hypertension No date: Neuropathy No date: Osteoarthritis  Reproductive/Obstetrics                             Anesthesia Physical Anesthesia Plan  ASA: II  Anesthesia Plan: General   Post-op Pain Management:    Induction: Intravenous  PONV Risk Score and Plan: 2 and Ondansetron, Propofol infusion and TIVA  Airway Management Planned: Nasal Cannula  Additional Equipment: None  Intra-op Plan:   Post-operative Plan:   Informed Consent: I have reviewed the patients History and Physical, chart, labs and discussed the procedure including the risks, benefits and alternatives for the proposed anesthesia with the patient or authorized representative who has indicated his/her understanding and acceptance.     Dental advisory  given  Plan Discussed with: CRNA and Surgeon  Anesthesia Plan Comments: (Discussed risks of anesthesia with patient, including possibility of difficulty with spontaneous ventilation under anesthesia necessitating airway intervention, PONV, and rare risks such as cardiac or respiratory or neurological events. Patient understands.)        Anesthesia Quick Evaluation

## 2020-04-13 NOTE — Transfer of Care (Signed)
Immediate Anesthesia Transfer of Care Note  Patient: Randy Gill  Procedure(s) Performed: COLONOSCOPY WITH PROPOFOL (N/A )  Patient Location: PACU  Anesthesia Type:General  Level of Consciousness: drowsy  Airway & Oxygen Therapy: Patient Spontanous Breathing  Post-op Assessment: Report given to RN and Post -op Vital signs reviewed and stable  Post vital signs: Reviewed and stable  Last Vitals:  Vitals Value Taken Time  BP 95/66 04/13/20 0958  Temp 36.1 C 04/13/20 0958  Pulse 69 04/13/20 0959  Resp 20 04/13/20 0959  SpO2 97 % 04/13/20 0959  Vitals shown include unvalidated device data.  Last Pain:  Vitals:   04/13/20 0958  TempSrc: Tympanic  PainSc: Asleep         Complications: No complications documented.

## 2020-04-13 NOTE — H&P (Signed)
Melodie Bouillon, MD [address], [address], [place], [place], [address] [address], [address], [place], [place], [address] Phone: [phone]  Fax: [phone]  Primary Care Physician:  Corky Downs, MD   Pre-Procedure History & Physical: HPI:  Randy Gill is a 64 y.o. male is here for a colonoscopy.   Past Medical History:  Diagnosis Date  . GERD (gastroesophageal reflux disease)   . Hypertension   . Neuropathy   . Osteoarthritis     Past Surgical History:  Procedure Laterality Date  . COLONOSCOPY WITH PROPOFOL      Prior to Admission medications   Medication Sig Start Date End Date Taking? Authorizing Provider  amLODipine (NORVASC) 10 MG tablet Take 10 mg by mouth daily. 01/20/20  Yes [provider]  DULoxetine (CYMBALTA) 60 MG capsule TAKE 1 CAPSULE BY MOUTH EVERY DAY 03/28/20  Yes Masoud, Renda Rolls, MD  losartan-hydrochlorothiazide (HYZAAR) 100-12.5 MG tablet Take 1 tablet by mouth daily. 02/09/20  Yes [provider]  meloxicam (MOBIC) 15 MG tablet TAKE ONE TABLET BY MOUTH EVERY DAY 03/29/20  Yes Masoud, Renda Rolls, MD  omeprazole (PRILOSEC) 20 MG capsule TAKE 1 CAPSULE BY MOUTH ONCE DAILY 02/09/20  Yes Masoud, Renda Rolls, MD  ALPRAZolam Prudy Feeler) 0.5 MG tablet Take 0.5 mg by mouth daily.    [provider]  sildenafil (REVATIO) 20 MG tablet Take 20 mg by mouth daily. 02/09/20   [provider]    Allergies as of 03/30/2020  . (No Known Allergies)    History reviewed. No pertinent family history.  Social History   Socioeconomic History  . Marital status: Married    Spouse name: Not on file  . Number of children: Not on file  . Years of education: Not on file  . Highest education level: Not on file  Occupational History  . Not on file  Tobacco Use  . Smoking status: Former Games developer  . Smokeless tobacco: Never Used  Substance and Sexual Activity  . Alcohol use: Yes  . Drug use: Not Currently    Types: Marijuana  . Sexual activity:  Yes  Other Topics Concern  . Not on file  Social History Narrative  . Not on file   Social Determinants of Health   Financial Resource Strain:   . Difficulty of Paying Living Expenses: Not on file  Food Insecurity:   . Worried About Programme researcher, broadcasting/film/video in the Last Year: Not on file  . Ran Out of Food in the Last Year: Not on file  Transportation Needs:   . Lack of Transportation (Medical): Not on file  . Lack of Transportation (Non-Medical): Not on file  Physical Activity:   . Days of Exercise per Week: Not on file  . Minutes of Exercise per Session: Not on file  Stress:   . Feeling of Stress : Not on file  Social Connections:   . Frequency of Communication with Friends and Family: Not on file  . Frequency of Social Gatherings with Friends and Family: Not on file  . Attends Religious Services: Not on file  . Active Member of Clubs or Organizations: Not on file  . Attends Banker Meetings: Not on file  . Marital Status: Not on file  Intimate Partner Violence:   . Fear of Current or Ex-Partner: Not on file  . Emotionally Abused: Not on file  . Physically Abused: Not on file  . Sexually Abused: Not on file    Review of Systems: See HPI, otherwise negative  ROS  Physical Exam: BP 127/89   Pulse 77   Temp 97.6 F (36.4 C) (Tympanic)   Resp 17   Ht 6' (1.829 m)   Wt 100.2 kg   SpO2 98%   BMI 29.97 kg/m  General:   Alert,  pleasant and cooperative in NAD Head:  Normocephalic and atraumatic. Neck:  Supple; no masses or thyromegaly. Lungs:  Clear throughout to auscultation, normal respiratory effort.    Heart:  +S1, +S2, Regular rate and rhythm, No edema. Abdomen:  Soft, nontender and nondistended. Normal bowel sounds, without guarding, and without rebound.   Neurologic:  Alert and  oriented x4;  grossly normal neurologically.  Impression/Plan: Randy Gill is here for a colonoscopy to be performed for average risk screening.  Risks, benefits,  limitations, and alternatives regarding  colonoscopy have been reviewed with the patient.  Questions have been answered.  All parties agreeable.   Pasty Spillers, MD  04/13/2020, 9:24 AM

## 2020-04-13 NOTE — Op Note (Signed)
[facility] Gastroenterology Patient Name: Randy Gill Procedure Date: 04/13/2020 8:50 AM MRN: [id number] Account #: [id number] Date of Birth: 10-Aug-1956 Admit Type: Outpatient Age: 64 Room: [facility] ENDO ROOM 2 Gender: Male Note Status: Finalized Procedure:             Colonoscopy Indications:           Screening for colorectal malignant neoplasm Providers:             Hattie Aguinaldo B. Maximino Greenlandahiliani MD, MD Medicines:             Monitored Anesthesia Care Complications:         No immediate complications. Procedure:             Pre-Anesthesia Assessment:                        - ASA Grade Assessment: II - A patient with mild                         systemic disease.                        - Prior to the procedure, a History and Physical was                         performed, and patient medications, allergies and                         sensitivities were reviewed. The patient's tolerance                         of previous anesthesia was reviewed.                        - The risks and benefits of the procedure and the                         sedation options and risks were discussed with the                         patient. All questions were answered and informed                         consent was obtained.                        - Patient identification and proposed procedure were                         verified prior to the procedure by the physician, the                         nurse, the anesthesiologist, the anesthetist and the                         technician. The procedure was verified in the                         procedure room.  After obtaining informed consent, the colonoscope was                         passed under direct vision. Throughout the procedure,                         the patient's blood pressure, pulse, and oxygen                         saturations were monitored continuously. The                         Colonoscope  was introduced through the anus and                         advanced to the the cecum, identified by appendiceal                         orifice and ileocecal valve. The colonoscopy was                         performed with ease. The patient tolerated the                         procedure well. The quality of the bowel preparation                         was fair. Findings:      The perianal and digital rectal examinations were normal.      A 4 mm polyp was found in the sigmoid colon. The polyp was flat. The       polyp was removed with a jumbo cold forceps. Resection and retrieval       were complete.      A 8 mm polyp was found in the sigmoid colon. The polyp was sessile. The       polyp was removed with a cold snare. Resection and retrieval were       complete. To prevent bleeding after the polypectomy, one hemostatic clip       was successfully placed. There was no bleeding at the end of the       procedure.      Multiple diverticula were found in the sigmoid colon.      The exam was otherwise without abnormality.      The rectum, sigmoid colon, descending colon, transverse colon, ascending       colon and cecum appeared normal.      The retroflexed view of the distal rectum and anal verge was normal and       showed no anal or rectal abnormalities. Impression:            - Preparation of the colon was fair.                        - One 4 mm polyp in the sigmoid colon, removed with a                         jumbo cold forceps. Resected and retrieved.                        -  One 8 mm polyp in the sigmoid colon, removed with a                         cold snare. Resected and retrieved. Clip was placed.                        - Diverticulosis in the sigmoid colon.                        - The examination was otherwise normal.                        - The rectum, sigmoid colon, descending colon,                         transverse colon, ascending colon and cecum are normal.                         - The distal rectum and anal verge are normal on                         retroflexion view. Recommendation:        - Discharge patient to home (with escort).                        - Advance diet as tolerated.                        - Continue present medications.                        - Await pathology results.                        - Repeat colonoscopy in 3 years, with 2 day prep.                        - The findings and recommendations were discussed with                         the patient.                        - The findings and recommendations were discussed with                         the patient's family.                        - Return to primary care physician as previously                         scheduled.                        - High fiber diet. Procedure Code(s):     --- Professional ---                        [phone], Colonoscopy, flexible; with removal of  tumor(s), polyp(s), or other lesion(s) by snare                         technique                        [id number], 59, Colonoscopy, flexible; with biopsy, single                         or multiple Diagnosis Code(s):     --- Professional ---                        Z12.11, Encounter for screening for malignant neoplasm                         of colon                        K63.5, Polyp of colon CPT copyright 2019 American Medical Association. All rights reserved. The codes documented in this report are preliminary and upon coder review may  be revised to meet current compliance requirements.  Melodie Bouillon, MD Michel Bickers B. Maximino Greenland MD, MD 04/13/2020 10:00:46 AM This report has been signed electronically. Number of Addenda: 0 Note Initiated On: 04/13/2020 8:50 AM Scope Withdrawal Time: 0 hours 17 minutes 53 seconds  Total Procedure Duration: 0 hours 22 minutes 42 seconds  Estimated Blood Loss:  Estimated blood loss: none.      [facility]

## 2020-04-14 ENCOUNTER — Encounter: Payer: Self-pay | Admitting: Gastroenterology

## 2020-04-14 LAB — SURGICAL PATHOLOGY

## 2020-04-15 ENCOUNTER — Encounter: Payer: Self-pay | Admitting: Gastroenterology

## 2020-06-16 ENCOUNTER — Other Ambulatory Visit: Payer: Self-pay | Admitting: *Deleted

## 2020-06-21 ENCOUNTER — Other Ambulatory Visit: Payer: Self-pay | Admitting: *Deleted

## 2020-06-21 MED ORDER — ALPRAZOLAM 0.5 MG PO TABS
0.5000 mg | ORAL_TABLET | Freq: Every day | ORAL | 0 refills | Status: DC
Start: 2020-06-21 — End: 2020-09-12

## 2020-09-12 ENCOUNTER — Encounter: Payer: Self-pay | Admitting: Internal Medicine

## 2020-09-12 ENCOUNTER — Other Ambulatory Visit: Payer: Self-pay

## 2020-09-12 ENCOUNTER — Ambulatory Visit (INDEPENDENT_AMBULATORY_CARE_PROVIDER_SITE_OTHER): Payer: Medicare Other | Admitting: Internal Medicine

## 2020-09-12 VITALS — BP 122/76 | HR 71 | Ht 72.0 in | Wt 234.4 lb

## 2020-09-12 DIAGNOSIS — F419 Anxiety disorder, unspecified: Secondary | ICD-10-CM | POA: Diagnosis not present

## 2020-09-12 DIAGNOSIS — M159 Polyosteoarthritis, unspecified: Secondary | ICD-10-CM

## 2020-09-12 DIAGNOSIS — K219 Gastro-esophageal reflux disease without esophagitis: Secondary | ICD-10-CM

## 2020-09-12 DIAGNOSIS — E8881 Metabolic syndrome: Secondary | ICD-10-CM

## 2020-09-12 DIAGNOSIS — Z1211 Encounter for screening for malignant neoplasm of colon: Secondary | ICD-10-CM

## 2020-09-12 MED ORDER — ALPRAZOLAM 0.5 MG PO TABS: 0.5000 mg | ORAL_TABLET | Freq: Two times a day (BID) | ORAL | 0 refills | Status: AC | PRN

## 2020-09-12 NOTE — Assessment & Plan Note (Signed)
  -   I encouraged the patient to eat more vegetables and whole wheat, and to avoid fatty foods like whole milk, hard cheese, egg yolks, margarine, baked sweets, and fried foods.  - I encouraged the patient to live an active lifestyle and complete activities for 40 minutes at least three times per week.  - I instructed the patient to go to the ER if they begin having chest pain.   

## 2020-09-12 NOTE — Assessment & Plan Note (Signed)
Patient has osteoarthritis of multiple joints.  I told him to stay active take vitamin D and use Tylenol as needed for the pain.  I told him to avoid meloxicam as much as he can

## 2020-09-12 NOTE — Assessment & Plan Note (Signed)
Patient has a colonoscopy and endoscopy done recently

## 2020-09-12 NOTE — Progress Notes (Signed)
Established Patient Office Visit  Subjective:  Patient ID: Randy Gill, male    DOB: 12/06/1955  Age: 65 y.o. MRN: [id number]  CC:  Chief Complaint  Patient presents with  . Hypertension    Patient is here for his 6 month follow up on his blood pressure.    HPI  Randy Gill presents for blood pressure check.  He is known to have reflux problem osteoarthritis of multiple joints and has a metabolic syndrome.  He has gained about 5 to 6 pounds of weight recently.  He denies any chest pain or shortness of breath.  Blood pressure seems to be under control because he checks it at home   Past Surgical History:  Procedure Laterality Date  . COLONOSCOPY WITH PROPOFOL    . COLONOSCOPY WITH PROPOFOL N/A 04/13/2020   Procedure: COLONOSCOPY WITH PROPOFOL;  Surgeon: Pasty Spillers, MD;  Location: [facility] ENDOSCOPY;  Service: Endoscopy;  Laterality: N/A;    History reviewed. No pertinent family history.  Social History   Socioeconomic History  . Marital status: Married    Spouse name: Not on file  . Number of children: Not on file  . Years of education: Not on file  . Highest education level: Not on file  Occupational History  . Not on file  Tobacco Use  . Smoking status: Former Games developer  . Smokeless tobacco: Never Used  Substance and Sexual Activity  . Alcohol use: Yes  . Drug use: Not Currently    Types: Marijuana  . Sexual activity: Yes  Other Topics Concern  . Not on file  Social History Narrative  . Not on file   Social Determinants of Health   Financial Resource Strain: Not on file  Food Insecurity: Not on file  Transportation Needs: Not on file  Physical Activity: Not on file  Stress: Not on file  Social Connections: Not on file  Intimate Partner Violence: Not on file     Current Outpatient Medications:  .  ALPRAZolam (XANAX) 0.5 MG tablet, Take 1 tablet (0.5 mg total) by mouth 2 (two) times daily as needed for anxiety., Disp: 60 tablet, Rfl: 0 .   amLODipine (NORVASC) 10 MG tablet, Take 10 mg by mouth daily., Disp: , Rfl:  .  DULoxetine (CYMBALTA) 60 MG capsule, TAKE 1 CAPSULE BY MOUTH EVERY DAY, Disp: 90 capsule, Rfl: 3 .  losartan-hydrochlorothiazide (HYZAAR) 100-12.5 MG tablet, Take 1 tablet by mouth daily., Disp: , Rfl:  .  meloxicam (MOBIC) 15 MG tablet, TAKE ONE TABLET BY MOUTH EVERY DAY, Disp: 90 tablet, Rfl: 0 .  omeprazole (PRILOSEC) 20 MG capsule, TAKE 1 CAPSULE BY MOUTH ONCE DAILY, Disp: 90 capsule, Rfl: 3 .  sildenafil (REVATIO) 20 MG tablet, Take 20 mg by mouth daily., Disp: , Rfl:    No Known Allergies  ROS Review of Systems  Constitutional: Negative.   HENT: Negative.   Eyes: Negative.   Respiratory: Negative.   Cardiovascular: Negative.   Gastrointestinal: Negative.   Endocrine: Negative.   Genitourinary: Negative.   Musculoskeletal: Negative.   Skin: Negative.   Allergic/Immunologic: Negative.   Neurological: Negative.   Hematological: Negative.   Psychiatric/Behavioral: Negative.   All other systems reviewed and are negative.     Objective:    Physical Exam Vitals reviewed.  Constitutional:      Appearance: Normal appearance.  HENT:     Mouth/Throat:     Mouth: Mucous membranes are moist.  Eyes:     Pupils: Pupils are equal, round,  and reactive to light.  Neck:     Vascular: No carotid bruit.  Cardiovascular:     Rate and Rhythm: Normal rate and regular rhythm.     Pulses: Normal pulses.     Heart sounds: Normal heart sounds.  Pulmonary:     Effort: Pulmonary effort is normal.     Breath sounds: Normal breath sounds.  Abdominal:     General: Bowel sounds are normal.     Palpations: Abdomen is soft. There is no hepatomegaly, splenomegaly or mass.     Tenderness: There is no abdominal tenderness.     Hernia: No hernia is present.  Musculoskeletal:     Cervical back: Neck supple.     Right lower leg: No edema.     Left lower leg: No edema.  Skin:    Findings: No rash.  Neurological:      Mental Status: He is alert and oriented to person, place, and time.     Motor: No weakness.  Psychiatric:        Mood and Affect: Mood normal.        Behavior: Behavior normal.     BP 122/76   Pulse 71   Ht 6' (1.829 m)   Wt 234 lb 6.4 oz (106.3 kg)   BMI 31.79 kg/m  Wt Readings from Last 3 Encounters:  09/12/20 234 lb 6.4 oz (106.3 kg)  04/13/20 221 lb (100.2 kg)  03/17/20 (!) 225 lb 4.8 oz (102.2 kg)     Health Maintenance Due  Topic Date Due  . Hepatitis C Screening  Never done  . HIV Screening  Never done  . TETANUS/TDAP  Never done  . PNA vac Low Risk Adult (1 of 2 - PCV13) Never done  . INFLUENZA VACCINE  Never done    There are no preventive care reminders to display for this patient.  Lab Results  Component Value Date   TSH 0.55 03/17/2020   Lab Results  Component Value Date   WBC 7.2 03/17/2020   HGB 15.7 03/17/2020   HCT 47.3 03/17/2020   MCV 87.8 03/17/2020   PLT 258 03/17/2020   Lab Results  Component Value Date   NA 137 03/17/2020   K 4.4 03/17/2020   CO2 25 03/17/2020   GLUCOSE 93 03/17/2020   BUN 17 03/17/2020   CREATININE 0.84 03/17/2020   BILITOT 0.8 03/17/2020   AST 17 03/17/2020   ALT 15 03/17/2020   PROT 7.2 03/17/2020   CALCIUM 9.2 03/17/2020   Lab Results  Component Value Date   CHOL 160 03/17/2020   Lab Results  Component Value Date   HDL 41 03/17/2020   Lab Results  Component Value Date   LDLCALC 98 03/17/2020   Lab Results  Component Value Date   TRIG 112 03/17/2020   Lab Results  Component Value Date   CHOLHDL 3.9 03/17/2020   No results found for: HGBA1C    Assessment & Plan:   Problem List Items Addressed This Visit      Digestive   Gastroesophageal reflux disease without esophagitis    - The patient's GERD is stable on medication.  - Instructed the patient to avoid eating spicy and acidic foods, as well as foods high in fat. - Instructed the patient to avoid eating large meals or meals 2-3 hours  prior to sleeping.         Musculoskeletal and Integument   Osteoarthritis of multiple joints    Patient has osteoarthritis of  multiple joints.  I told him to stay active take vitamin D and use Tylenol as needed for the pain.  I told him to avoid meloxicam as much as he can        Other   Metabolic syndrome      - I encouraged the patient to eat more vegetables and whole wheat, and to avoid fatty foods like whole milk, hard cheese, egg yolks, margarine, baked sweets, and fried foods.  - I encouraged the patient to live an active lifestyle and complete activities for 40 minutes at least three times per week.  - I instructed the patient to go to the ER if they begin having chest pain.        Encounter for screening colonoscopy    Patient has a colonoscopy and endoscopy done recently       Other Visit Diagnoses    Anxiety    -  Primary   Relevant Medications   ALPRAZolam (XANAX) 0.5 MG tablet      Meds ordered this encounter  Medications  . ALPRAZolam (XANAX) 0.5 MG tablet    Sig: Take 1 tablet (0.5 mg total) by mouth 2 (two) times daily as needed for anxiety.    Dispense:  60 tablet    Refill:  0    Follow-up: No follow-ups on file.    Corky Downs, MD

## 2020-09-12 NOTE — Assessment & Plan Note (Signed)
-   The patient's GERD is stable on medication.  - Instructed the patient to avoid eating spicy and acidic foods, as well as foods high in fat. - Instructed the patient to avoid eating large meals or meals 2-3 hours prior to sleeping. 

## 2020-10-07 ENCOUNTER — Other Ambulatory Visit: Payer: Self-pay | Admitting: Internal Medicine

## 2020-10-07 ENCOUNTER — Other Ambulatory Visit: Payer: Self-pay | Admitting: *Deleted

## 2020-10-07 MED ORDER — LOSARTAN POTASSIUM-HCTZ 100-12.5 MG PO TABS: 1.0000 | ORAL_TABLET | Freq: Every day | ORAL | 3 refills | Status: DC

## 2020-10-07 MED ORDER — AMLODIPINE BESYLATE 10 MG PO TABS
10.0000 mg | ORAL_TABLET | Freq: Every day | ORAL | 3 refills | Status: DC
Start: 2020-10-07 — End: 2021-03-29

## 2020-11-24 ENCOUNTER — Other Ambulatory Visit: Payer: Self-pay | Admitting: *Deleted

## 2020-11-24 MED ORDER — MELOXICAM 15 MG PO TABS: 1.0000 | ORAL_TABLET | Freq: Every day | ORAL | 3 refills | Status: DC

## 2021-01-31 ENCOUNTER — Other Ambulatory Visit: Payer: Self-pay | Admitting: Internal Medicine

## 2021-03-13 ENCOUNTER — Other Ambulatory Visit: Payer: Self-pay

## 2021-03-13 ENCOUNTER — Ambulatory Visit: Payer: Medicare Other | Admitting: Internal Medicine

## 2021-03-29 ENCOUNTER — Ambulatory Visit (INDEPENDENT_AMBULATORY_CARE_PROVIDER_SITE_OTHER): Payer: Medicare Other | Admitting: Internal Medicine

## 2021-03-29 ENCOUNTER — Other Ambulatory Visit: Payer: Self-pay

## 2021-03-29 ENCOUNTER — Encounter: Payer: Self-pay | Admitting: Internal Medicine

## 2021-03-29 ENCOUNTER — Other Ambulatory Visit: Payer: Self-pay | Admitting: Internal Medicine

## 2021-03-29 VITALS — BP 121/89 | HR 80 | Ht 72.0 in | Wt 227.4 lb

## 2021-03-29 DIAGNOSIS — E8881 Metabolic syndrome: Secondary | ICD-10-CM

## 2021-03-29 DIAGNOSIS — K219 Gastro-esophageal reflux disease without esophagitis: Secondary | ICD-10-CM

## 2021-03-29 DIAGNOSIS — M159 Polyosteoarthritis, unspecified: Secondary | ICD-10-CM | POA: Diagnosis not present

## 2021-03-29 DIAGNOSIS — Z0000 Encounter for general adult medical examination without abnormal findings: Secondary | ICD-10-CM | POA: Diagnosis not present

## 2021-03-29 MED ORDER — ALPRAZOLAM 0.5 MG PO TABS: 0.5000 mg | ORAL_TABLET | Freq: Every day | ORAL | 0 refills | Status: AC

## 2021-03-29 MED ORDER — DULOXETINE HCL 60 MG PO CPEP: ORAL_CAPSULE | ORAL | 3 refills | Status: AC

## 2021-03-29 MED ORDER — SILDENAFIL CITRATE 20 MG PO TABS: ORAL_TABLET | ORAL | 3 refills | Status: AC

## 2021-03-29 MED ORDER — LOSARTAN POTASSIUM-HCTZ 100-12.5 MG PO TABS: 1.0000 | ORAL_TABLET | Freq: Every day | ORAL | 3 refills | Status: AC

## 2021-03-29 MED ORDER — AMLODIPINE BESYLATE 10 MG PO TABS: 10.0000 mg | ORAL_TABLET | Freq: Every day | ORAL | 3 refills | Status: AC

## 2021-03-29 MED ORDER — OMEPRAZOLE 20 MG PO CPDR: 20.0000 mg | DELAYED_RELEASE_CAPSULE | Freq: Every day | ORAL | 3 refills | Status: DC

## 2021-03-29 MED ORDER — MELOXICAM 15 MG PO TABS: 15.0000 mg | ORAL_TABLET | Freq: Every day | ORAL | 3 refills | Status: AC

## 2021-03-29 NOTE — Assessment & Plan Note (Signed)
Patient denies any chest pain or shortness of breath heart is regular chest is clear abdomen is soft nontender without any hepatosplenomegaly there is no pedal edema no calf tenderness.  Patient was advised to lose weight walk on a daily basis.  Take vitamin D 1000 units every day.

## 2021-03-29 NOTE — Progress Notes (Signed)
Established Patient Office Visit  Subjective:  Patient ID: Randy Gill, male    DOB: [birth year]  Age: 65 y.o. MRN: [id number]  CC:  Chief Complaint  Patient presents with   Annual Exam    HPI  Shondell Poulson presents for her physical, patient denies any chest pain or shortness of breath.  He has lost some weight recently.  His blood pressure is under control.  He has a problem with osteoarthritis.  Reflux is stable.  He also has neuropathy under control. Past Medical History:  Diagnosis Date   GERD (gastroesophageal reflux disease)    Hypertension    Neuropathy    Osteoarthritis     Past Surgical History:  Procedure Laterality Date   COLONOSCOPY WITH PROPOFOL     COLONOSCOPY WITH PROPOFOL N/A 04/13/2020   Procedure: COLONOSCOPY WITH PROPOFOL;  Surgeon: Pasty Spillers, MD;  Location: [facility] ENDOSCOPY;  Service: Endoscopy;  Laterality: N/A;    History reviewed. No pertinent family history.  Social History   Socioeconomic History   Marital status: Married    Spouse name: Not on file   Number of children: Not on file   Years of education: Not on file   Highest education level: Not on file  Occupational History   Not on file  Tobacco Use   Smoking status: Former   Smokeless tobacco: Never  Substance and Sexual Activity   Alcohol use: Yes   Drug use: Not Currently    Types: Marijuana   Sexual activity: Yes  Other Topics Concern   Not on file  Social History Narrative   Not on file   Social Determinants of Health   Financial Resource Strain: Not on file  Food Insecurity: Not on file  Transportation Needs: Not on file  Physical Activity: Not on file  Stress: Not on file  Social Connections: Not on file  Intimate Partner Violence: Not on file     Current Outpatient Medications:    amLODipine (NORVASC) 10 MG tablet, Take 1 tablet (10 mg total) by mouth daily., Disp: 90 tablet, Rfl: 3   DULoxetine (CYMBALTA) 60 MG capsule, TAKE 1 CAPSULE BY MOUTH EVERY  DAY, Disp: 90 capsule, Rfl: 3   losartan-hydrochlorothiazide (HYZAAR) 100-12.5 MG tablet, Take 1 tablet by mouth daily., Disp: 90 tablet, Rfl: 3   meloxicam (MOBIC) 15 MG tablet, Take 1 tablet (15 mg total) by mouth daily., Disp: 90 tablet, Rfl: 3   omeprazole (PRILOSEC) 20 MG capsule, TAKE 1 CAPSULE BY MOUTH ONCE DAILY, Disp: 90 capsule, Rfl: 3   sildenafil (REVATIO) 20 MG tablet, TAKE 3 TO 5 TABLETS BY MOUTH AS NEEDED, Disp: 90 tablet, Rfl: 3   No Known Allergies  ROS Review of Systems  Constitutional: Negative.   HENT: Negative.    Eyes: Negative.   Respiratory: Negative.    Cardiovascular: Negative.   Gastrointestinal: Negative.   Endocrine: Negative.   Genitourinary: Negative.   Musculoskeletal: Negative.   Skin: Negative.   Allergic/Immunologic: Negative.   Neurological: Negative.   Hematological: Negative.   Psychiatric/Behavioral: Negative.    All other systems reviewed and are negative.    Objective:    Physical Exam Vitals reviewed.  Constitutional:      Appearance: Normal appearance.  HENT:     Mouth/Throat:     Mouth: Mucous membranes are moist.  Eyes:     Pupils: Pupils are equal, round, and reactive to light.  Neck:     Vascular: No carotid bruit.  Cardiovascular:  Rate and Rhythm: Normal rate and regular rhythm.     Pulses: Normal pulses.     Heart sounds: Normal heart sounds.  Pulmonary:     Effort: Pulmonary effort is normal.     Breath sounds: Normal breath sounds.  Abdominal:     General: Bowel sounds are normal.     Palpations: Abdomen is soft. There is no hepatomegaly, splenomegaly or mass.     Tenderness: There is no abdominal tenderness.     Hernia: No hernia is present.  Musculoskeletal:     Cervical back: Neck supple.     Right lower leg: No edema.     Left lower leg: No edema.  Skin:    Findings: No rash.  Neurological:     Mental Status: He is alert and oriented to person, place, and time.     Motor: No weakness.  Psychiatric:         Mood and Affect: Mood normal.        Behavior: Behavior normal.    BP 121/89   Pulse 80   Ht 6' (1.829 m)   Wt 227 lb 6.4 oz (103.1 kg)   BMI 30.84 kg/m  Wt Readings from Last 3 Encounters:  03/29/21 227 lb 6.4 oz (103.1 kg)  09/12/20 234 lb 6.4 oz (106.3 kg)  04/13/20 221 lb (100.2 kg)     Health Maintenance Due  Topic Date Due   Hepatitis C Screening  Never done   Zoster Vaccines- Shingrix (1 of 2) Never done   TETANUS/TDAP  06/17/2018   INFLUENZA VACCINE  03/20/2021    There are no preventive care reminders to display for this patient.  Lab Results  Component Value Date   TSH 0.55 03/17/2020   Lab Results  Component Value Date   WBC 7.2 03/17/2020   HGB 15.7 03/17/2020   HCT 47.3 03/17/2020   MCV 87.8 03/17/2020   PLT 258 03/17/2020   Lab Results  Component Value Date   NA 137 03/17/2020   K 4.4 03/17/2020   CO2 25 03/17/2020   GLUCOSE 93 03/17/2020   BUN 17 03/17/2020   CREATININE 0.84 03/17/2020   BILITOT 0.8 03/17/2020   AST 17 03/17/2020   ALT 15 03/17/2020   PROT 7.2 03/17/2020   CALCIUM 9.2 03/17/2020   Lab Results  Component Value Date   CHOL 160 03/17/2020   Lab Results  Component Value Date   HDL 41 03/17/2020   Lab Results  Component Value Date   LDLCALC 98 03/17/2020   Lab Results  Component Value Date   TRIG 112 03/17/2020   Lab Results  Component Value Date   CHOLHDL 3.9 03/17/2020   No results found for: HGBA1C    Assessment & Plan:   Problem List Items Addressed This Visit       Digestive   Gastroesophageal reflux disease without esophagitis - Primary    - The patient's GERD is stable on medication.  - Instructed the patient to avoid eating spicy and acidic foods, as well as foods high in fat. - Instructed the patient to avoid eating large meals or meals 2-3 hours prior to sleeping.         Musculoskeletal and Integument   Osteoarthritis of multiple joints    Stable at the present time          Other   Metabolic syndrome    - I encouraged the patient to lose weight.  - I educated them on making  healthy dietary choices including eating more fruits and vegetables and less fried foods. - I encouraged the patient to exercise more, and educated on the benefits of exercise including weight loss, diabetes prevention, and hypertension prevention.   Dietary counseling with a registered dietician  Referral to a weight management support group (e.g. Weight Watchers, Overeaters Anonymous)  If your BMI is greater than 29 or you have gained more than 15 pounds you should work on weight loss.  Attend a healthy cooking class        Annual physical exam    Patient denies any chest pain or shortness of breath heart is regular chest is clear abdomen is soft nontender without any hepatosplenomegaly there is no pedal edema no calf tenderness.  Patient was advised to lose weight walk on a daily basis.  Take vitamin D 1000 units every day.        No orders of the defined types were placed in this encounter.   Follow-up: No follow-ups on file.    Corky Downs, MD

## 2021-03-29 NOTE — Addendum Note (Signed)
Addended by: Jobie Quaker on: 03/29/2021 09:57 AM   Modules accepted: Orders

## 2021-03-29 NOTE — Assessment & Plan Note (Signed)
-   The patient's GERD is stable on medication.  - Instructed the patient to avoid eating spicy and acidic foods, as well as foods high in fat. - Instructed the patient to avoid eating large meals or meals 2-3 hours prior to sleeping. 

## 2021-03-29 NOTE — Assessment & Plan Note (Signed)
Stable at the present time. 

## 2021-03-29 NOTE — Assessment & Plan Note (Signed)
